# Patient Record
Sex: Female | Born: 2000 | Race: Black or African American | Hispanic: No | Marital: Single | Smoking: Never smoker
Health system: Southern US, Community
[De-identification: ages and names within clinical notes are randomized; demographics above are authoritative.]

## PROBLEM LIST (undated history)

## (undated) ENCOUNTER — Ambulatory Visit: Disposition: A | Discharge status: Still In Hospital | Payer: Self-pay

---
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
## 2021-07-27 ENCOUNTER — Ambulatory Visit: Payer: Commercial Managed Care - PPO | Admitting: Rheumatology

## 2021-08-25 ENCOUNTER — Ambulatory Visit: Payer: Commercial Managed Care - PPO | Admitting: Rheumatology

## 2021-08-28 NOTE — Progress Notes (Signed)
? ?Office Visit Note ? ?Patient: Karen Wilkins             ?Date of Birth: [birth year]           ?MRN: [id number]             ?PCP: Jane Canary, MD ?Referring: Jane Canary, MD ?Visit Date: 09/09/2021 ?Occupation: @GUAROCC @ ? ?Subjective:  ?Joint pain, myalgias, positive ANA ? ?History of Present Illness: Karen Wilkins is a 21 y.o. female seen in consultation per request of her PCP.  According to the patient in November 2022 she developed a cough.  She was seen at the urgent care and was diagnosed with strep throat.  She states her throat culture was positive.  She was treated with penicillin.  2 weeks later she started experiencing increased fatigue, generalized joint and muscle pain and abdominal discomfort.  She also started having headaches.  She has had chronic diarrhea since then without any blood with 1-2 loose stools daily.  She states symptoms lasted for approximately 2 months and improved.  In December 2022 she saw her PCP due to aches and pains at that time the labs showed positive ANA and positive Ro antibody.  In February 2023 she developed upper respiratory tract infection which gradually got better.  She was not given any antibiotics.  She states she has been continuing to have some generalized achiness.  She describes discomfort in her upper back, lower back, shoulders, wrists, hands, hips, feet.  She denies any history of joint swelling.  There is no history of oral ulcers or nasal ulcers.  She gives history of dry mouth.  She denies history of morning stiffness, Raynaud's phenomenon, malar rash, photosensitivity or lymphadenopathy.  She is gravida 1, para 0, abortion 1.  She is currently not using any birth control.  She is sexually active.  There is positive family history of lupus in her father.  According to the patient her father had lupus nephritis and passed away in [id number] due to complications.  She has 1 female sibling with diabetes and 1 female sibling  in good health. ? ?Activities  of Daily Living:  ?Patient reports morning stiffness for 0  none .   ?Patient Denies nocturnal pain.  ?Difficulty dressing/grooming: Denies ?Difficulty climbing stairs: Denies ?Difficulty getting out of chair: Denies ?Difficulty using hands for taps, buttons, cutlery, and/or writing: Denies ? ?Review of Systems  ?Constitutional:  Positive for fatigue.  ?HENT:  Positive for mouth dryness. Negative for mouth sores.   ?Eyes:  Negative for dryness.  ?Respiratory:  Negative for shortness of breath.   ?Cardiovascular:  Negative for chest pain, palpitations and swelling in legs/feet.  ?Gastrointestinal:  Positive for constipation and diarrhea.  ?Endocrine: Positive for cold intolerance and excessive thirst.  ?Genitourinary:  Negative for difficulty urinating.  ?Musculoskeletal:  Positive for joint pain, joint pain and muscle tenderness. Negative for muscle weakness and morning stiffness.  ?Skin:  Negative for color change, rash and sensitivity to sunlight.  ?Allergic/Immunologic: Positive for susceptible to infections.  ?Neurological:  Positive for numbness and weakness.  ?Hematological:  Negative for bruising/bleeding tendency and swollen glands.  ?Psychiatric/Behavioral:  Negative for depressed mood and sleep disturbance. The patient is not nervous/anxious.   ? ?PMFS History:  ?There are no problems to display for this patient. ?  ?History reviewed. No pertinent past medical history.  ?Family History  ?Problem Relation Age of Onset  ? Lupus Father   ? Diabetes Mellitus I Brother   ? ?History  reviewed. No pertinent surgical history. ?Social History  ? ?Social History Narrative  ? Not on file  ? ?Immunization History  ?Administered Date(s) Administered  ? Influenza,inj,Quad PF,6+ Mos 04/14/2018, 03/28/2019  ?  ? ?Objective: ?Vital Signs: BP 122/79 (BP Location: Right Arm, Patient Position: Sitting, Cuff Size: Normal)   Pulse 67   Resp 14   Ht 5\' 2"  (1.575 m)   Wt 140 lb (63.5 kg)   BMI 25.61 kg/m?   ? ?Physical  Exam ?Vitals and nursing note reviewed.  ?Constitutional:   ?   Appearance: She is well-developed.  ?HENT:  ?   Head: Normocephalic and atraumatic.  ?Eyes:  ?   Conjunctiva/sclera: Conjunctivae normal.  ?Cardiovascular:  ?   Rate and Rhythm: Normal rate and regular rhythm.  ?   Heart sounds: Normal heart sounds.  ?Pulmonary:  ?   Effort: Pulmonary effort is normal.  ?   Breath sounds: Normal breath sounds.  ?Abdominal:  ?   General: Bowel sounds are normal.  ?   Palpations: Abdomen is soft.  ?Musculoskeletal:  ?   Cervical back: Normal range of motion.  ?Lymphadenopathy:  ?   Cervical: No cervical adenopathy.  ?Skin: ?   General: Skin is warm and dry.  ?   Capillary Refill: Capillary refill takes less than 2 seconds.  ?   Comments: No nailbed capillary changes, sclerodactyly or Telengectesia's were noted.  ?Neurological:  ?   Mental Status: She is alert and oriented to person, place, and time.  ?Psychiatric:     ?   Behavior: Behavior normal.  ?  ? ?Musculoskeletal Exam: C-spine thoracic and lumbar spine were in good range of motion.  Shoulder joints, elbow joints, wrist joints, MCPs PIPs and DIPs with good range of motion with no synovitis.  Hip joints, knee joints, ankles, MTPs and PIPs with good range of motion with no synovitis. ? ?CDAI Exam: ?CDAI Score: -- ?Patient Global: --; Provider Global: -- ?Swollen: --; Tender: -- ?Joint Exam 09/09/2021  ? ?No joint exam has been documented for this visit  ? ?There is currently no information documented on the homunculus. Go to the Rheumatology activity and complete the homunculus joint exam. ? ?Investigation: ?No additional findings. ? ?Imaging: ?XR Hand 2 View Left ? ?Result Date: 09/09/2021 ?No MCP PIP or DIP narrowing was noted.  No intercarpal or radiocarpal joint space narrowing was noted.  No erosive changes were noted.  Impression: Unremarkable x-ray of the hand. ? ?XR Hand 2 View Right ? ?Result Date: 09/09/2021 ?No MCP PIP or DIP narrowing was noted.  No  intercarpal or radiocarpal joint space narrowing was noted.  No erosive changes were noted.  Impression: Unremarkable x-ray of the hand.  ? ?Recent Labs: ?Lab Results  ?Component Value Date  ? WBC 3.8 (L) 07/04/2019  ? HGB 13.4 07/04/2019  ? PLT 218 07/04/2019  ? NA 136 07/04/2019  ? K 3.5 07/04/2019  ? CL 104 07/04/2019  ? CO2 24 07/04/2019  ? GLUCOSE 86 07/04/2019  ? BUN 19 07/04/2019  ? CREATININE 0.78 07/04/2019  ? BILITOT 0.4 10/01/2017  ? ALKPHOS 48 10/01/2017  ? AST 22 10/01/2017  ? ALT 15 10/01/2017  ? PROT 7.3 10/01/2017  ? ALBUMIN 4.0 10/01/2017  ? CALCIUM 9.0 07/04/2019  ? GFRAA >60 07/04/2019  ? ? ?Speciality Comments: No specialty comments available. ? ?Procedures:  ?No procedures performed ?Allergies: Azithromycin  ? ?Assessment / Plan:     ?Visit Diagnoses: Positive ANA (antinuclear antibody) - 06/02/21:  ANA+, dsDNA 2, RNP-, Sm-, Scl-70-, Ro >8, La-, antichromatin-, Anti-Jo1-, anti-centromere- -patient developed positive ANA and positive Ro antibody after strep infection in November 2022.  She has been experiencing increased fatigue generalized arthralgias and myalgias since then.  She denies history of joint swelling.  She gives history of fatigue and diarrhea.  There is no history of oral ulcers, nasal ulcers, malar rash, photosensitivity or lymphadenopathy.  She gives history of sicca symptoms.  Detailed counsel regarding positive ANA and positive Ro antibody was provided to the patient and her mother.  Association of positive Ro antibody with arrhythmias was discussed.  I advised her to get baseline EKG with her PCP.  Association of anti-Ro antibody with fetal cardiac arrhythmias was also discussed.  I will repeat labs today.  There is family history of lupus nephritis in her father.  Use of strict contraception was discussed.  Patient had an abortion in the past.  She uses condoms for contraception currently.  Use of sunscreen was also discussed.  Plan: CBC with Differential/Platelet, COMPLETE  METABOLIC PANEL WITH GFR, Urinalysis, Routine w reflex microscopic, Protein / creatinine ratio, urine, Sedimentation rate, ANA, Anti-scleroderma antibody, RNP Antibody, Anti-Smith antibody, Sjogrens syndrome-A extractable

## 2021-09-09 ENCOUNTER — Ambulatory Visit (INDEPENDENT_AMBULATORY_CARE_PROVIDER_SITE_OTHER): Payer: Commercial Managed Care - PPO

## 2021-09-09 ENCOUNTER — Encounter: Payer: Self-pay | Admitting: Rheumatology

## 2021-09-09 ENCOUNTER — Ambulatory Visit: Payer: Commercial Managed Care - PPO | Admitting: Rheumatology

## 2021-09-09 ENCOUNTER — Other Ambulatory Visit: Payer: Self-pay

## 2021-09-09 VITALS — BP 122/79 | HR 67 | Resp 14 | Ht 62.0 in | Wt 140.0 lb

## 2021-09-09 DIAGNOSIS — R682 Dry mouth, unspecified: Secondary | ICD-10-CM

## 2021-09-09 DIAGNOSIS — M79641 Pain in right hand: Secondary | ICD-10-CM

## 2021-09-09 DIAGNOSIS — R768 Other specified abnormal immunological findings in serum: Secondary | ICD-10-CM | POA: Diagnosis not present

## 2021-09-09 DIAGNOSIS — K529 Noninfective gastroenteritis and colitis, unspecified: Secondary | ICD-10-CM

## 2021-09-09 DIAGNOSIS — M2550 Pain in unspecified joint: Secondary | ICD-10-CM | POA: Diagnosis not present

## 2021-09-09 DIAGNOSIS — Z8269 Family history of other diseases of the musculoskeletal system and connective tissue: Secondary | ICD-10-CM

## 2021-09-09 DIAGNOSIS — M79642 Pain in left hand: Secondary | ICD-10-CM

## 2021-09-09 DIAGNOSIS — R5383 Other fatigue: Secondary | ICD-10-CM

## 2021-09-09 NOTE — Patient Instructions (Signed)
Use sunscreen more than 50 SPF all the time. ? ?Please schedule appointment with GYN for low estrogen contraception or IUD device. ? ?Please get baseline EKG with your PCP to rule out arrhythmias. ?

## 2021-09-15 ENCOUNTER — Telehealth: Payer: Self-pay | Admitting: *Deleted

## 2021-09-15 NOTE — Telephone Encounter (Signed)
Patient contacted the office concerned about her lab results which can be seen on my chart. Patient advised not all of her results are in and Dr. Corliss Skains would review them once they all return. Patient advised Dr. Corliss Skains reviews these with her at her follow up visit.Patient advised if Dr. Corliss Skains wants her to be seen sooner we will do so. Patient expressed understanding.  ?

## 2021-09-16 LAB — RHEUMATOID FACTOR: Rheumatoid fact SerPl-aCnc: <14 IU/mL (ref <14)

## 2021-09-16 LAB — CBC WITH DIFFERENTIAL/PLATELET
Absolute Monocytes: 357 cells/uL (ref 200–950)
Basophils Absolute: 21 cells/uL (ref 0–200)
Basophils Relative: 0.5 %
Eosinophils Absolute: 111 cells/uL (ref 15–500)
Eosinophils Relative: 2.7 %
HCT: 40.5 % (ref 35.0–45.0)
Hemoglobin: 13.2 g/dL (ref 11.7–15.5)
Lymphs Abs: 1435 cells/uL (ref 850–3900)
MCH: 30.3 pg (ref 27.0–33.0)
MCHC: 32.6 g/dL (ref 32.0–36.0)
MCV: 92.9 fL (ref 80.0–100.0)
MPV: 10.4 fL (ref 7.5–12.5)
Monocytes Relative: 8.7 %
Neutro Abs: 2177 cells/uL (ref 1500–7800)
Neutrophils Relative %: 53.1 %
Platelets: 255 10*3/uL (ref 140–400)
RBC: 4.36 10*6/uL (ref 3.80–5.10)
RDW: 12.8 % (ref 11.0–15.0)
Total Lymphocyte: 35 %
WBC: 4.1 10*3/uL (ref 3.8–10.8)

## 2021-09-16 LAB — PROTEIN / CREATININE RATIO, URINE
Creatinine, Urine: 465 mg/dL — ABNORMAL HIGH (ref 20–275)
Protein/Creat Ratio: 49 mg/g creat (ref 24–184)
Protein/Creatinine Ratio: 0.049 mg/mg creat (ref 0.024–0.184)
Total Protein, Urine: 23 mg/dL (ref 5–24)

## 2021-09-16 LAB — COMPLETE METABOLIC PANEL WITH GFR
AG Ratio: 1.4 (calc) (ref 1.0–2.5)
ALT: 12 U/L (ref 6–29)
AST: 16 U/L (ref 10–30)
Albumin: 4.4 g/dL (ref 3.6–5.1)
Alkaline phosphatase (APISO): 53 U/L (ref 31–125)
BUN: 11 mg/dL (ref 7–25)
CO2: 27 mmol/L (ref 20–32)
Calcium: 9.2 mg/dL (ref 8.6–10.2)
Chloride: 102 mmol/L (ref 98–110)
Creat: 0.75 mg/dL (ref 0.50–0.96)
Globulin: 3.2 g/dL (calc) (ref 1.9–3.7)
Glucose, Bld: 81 mg/dL (ref 65–99)
Potassium: 4.2 mmol/L (ref 3.5–5.3)
Sodium: 139 mmol/L (ref 135–146)
Total Bilirubin: 0.5 mg/dL (ref 0.2–1.2)
Total Protein: 7.6 g/dL (ref 6.1–8.1)
eGFR: 116 mL/min/{1.73_m2} (ref >=60)

## 2021-09-16 LAB — MICROSCOPIC MESSAGE

## 2021-09-16 LAB — RNP ANTIBODY: Ribonucleic Protein(ENA) Antibody, IgG: <1 AI

## 2021-09-16 LAB — URINALYSIS, ROUTINE W REFLEX MICROSCOPIC
Bacteria, UA: NONE SEEN /HPF
Bilirubin Urine: NEGATIVE
Glucose, UA: NEGATIVE
Hgb urine dipstick: NEGATIVE
Leukocytes,Ua: NEGATIVE
Nitrite: NEGATIVE
RBC / HPF: NONE SEEN /HPF (ref 0–2)
Specific Gravity, Urine: 1.028 (ref 1.001–1.035)
WBC, UA: NONE SEEN /HPF (ref 0–5)
pH: 5.5 (ref 5.0–8.0)

## 2021-09-16 LAB — CARDIOLIPIN ANTIBODIES, IGG, IGM, IGA
Anticardiolipin IgA: <2 APL-U/mL (ref <20.0)
Anticardiolipin IgG: <2 GPL-U/mL (ref <20.0)
Anticardiolipin IgM: <2 MPL-U/mL (ref <20.0)

## 2021-09-16 LAB — ANTI-SCLERODERMA ANTIBODY: Scleroderma (Scl-70) (ENA) Antibody, IgG: <1 AI

## 2021-09-16 LAB — BETA-2 GLYCOPROTEIN ANTIBODIES
Beta-2 Glyco 1 IgA: <2 U/mL (ref <20.0)
Beta-2 Glyco 1 IgM: <2 U/mL (ref <20.0)
Beta-2 Glyco I IgG: <2 U/mL (ref <20.0)

## 2021-09-16 LAB — ANTI-DNA ANTIBODY, DOUBLE-STRANDED: ds DNA Ab: 1 IU/mL

## 2021-09-16 LAB — TSH: TSH: 0.71 mIU/L

## 2021-09-16 LAB — CK: Total CK: 91 U/L (ref 29–143)

## 2021-09-16 LAB — C3 AND C4
C3 Complement: 112 mg/dL (ref 83–193)
C4 Complement: 32 mg/dL (ref 15–57)

## 2021-09-16 LAB — SJOGRENS SYNDROME-A EXTRACTABLE NUCLEAR ANTIBODY: SSA (Ro) (ENA) Antibody, IgG: >8 AI — AB

## 2021-09-16 LAB — LUPUS ANTICOAGULANT EVAL W/ REFLEX
PTT-LA Screen: 35 s (ref <=40)
dRVVT: 34 s (ref <=45)

## 2021-09-16 LAB — ANTI-NUCLEAR AB-TITER (ANA TITER)
ANA TITER: 1:320 {titer} — ABNORMAL HIGH
ANA Titer 1: 1:40 {titer} — ABNORMAL HIGH

## 2021-09-16 LAB — SJOGRENS SYNDROME-B EXTRACTABLE NUCLEAR ANTIBODY: SSB (La) (ENA) Antibody, IgG: <1 AI

## 2021-09-16 LAB — SEDIMENTATION RATE: Sed Rate: 9 mm/h (ref 0–20)

## 2021-09-16 LAB — ANTI-SMITH ANTIBODY: ENA SM Ab Ser-aCnc: <1 AI

## 2021-09-16 LAB — ANA: Anti Nuclear Antibody (ANA): POSITIVE — AB

## 2021-09-16 LAB — GLUCOSE 6 PHOSPHATE DEHYDROGENASE: G-6PDH: 13.5 U/g Hgb (ref 7.0–20.5)

## 2021-09-16 NOTE — Progress Notes (Signed)
I will discuss results at the follow-up visit.

## 2021-09-20 NOTE — Progress Notes (Deleted)
Office Visit Note  Patient: Karen Wilkins             Date of Birth: 01-03-2001           MRN: [id number]             PCP: Tommy Medal, MD Referring: Tommy Medal, MD Visit Date: 10/01/2021 Occupation: @GUAROCC @  Subjective:  No chief complaint on file.   History of Present Illness: Karen Wilkins is a 21 y.o. female ***   Activities of Daily Living:  Patient reports morning stiffness for *** {minute/hour:19697}.   Patient {ACTIONS;DENIES/REPORTS:21021675::"Denies"} nocturnal pain.  Difficulty dressing/grooming: {ACTIONS;DENIES/REPORTS:21021675::"Denies"} Difficulty climbing stairs: {ACTIONS;DENIES/REPORTS:21021675::"Denies"} Difficulty getting out of chair: {ACTIONS;DENIES/REPORTS:21021675::"Denies"} Difficulty using hands for taps, buttons, cutlery, and/or writing: {ACTIONS;DENIES/REPORTS:21021675::"Denies"}  No Rheumatology ROS completed.   PMFS History:  Patient Active Problem List   Diagnosis Date Noted   Family history of systemic lupus erythematosus in father 09/09/2021   Positive ANA (antinuclear antibody) 09/09/2021    No past medical history on file.  Family History  Problem Relation Age of Onset   Lupus Father    Diabetes Mellitus I Brother    No past surgical history on file. Social History   Social History Narrative   Not on file   Immunization History  Administered Date(s) Administered   Influenza,inj,Quad PF,6+ Mos 04/14/2018, 03/28/2019     Objective: Vital Signs: There were no vitals taken for this visit.   Physical Exam   Musculoskeletal Exam: ***  CDAI Exam: CDAI Score: -- Patient Global: --; Provider Global: -- Swollen: --; Tender: -- Joint Exam 10/01/2021   No joint exam has been documented for this visit   There is currently no information documented on the homunculus. Go to the Rheumatology activity and complete the homunculus joint exam.  Investigation: No additional findings.  Imaging: XR Hand 2 View  Left  Result Date: 09/09/2021 No MCP PIP or DIP narrowing was noted.  No intercarpal or radiocarpal joint space narrowing was noted.  No erosive changes were noted.  Impression: Unremarkable x-ray of the hand.  XR Hand 2 View Right  Result Date: 09/09/2021 No MCP PIP or DIP narrowing was noted.  No intercarpal or radiocarpal joint space narrowing was noted.  No erosive changes were noted.  Impression: Unremarkable x-ray of the hand.   Recent Labs: Lab Results  Component Value Date   WBC 4.1 09/09/2021   HGB 13.2 09/09/2021   PLT 255 09/09/2021   NA 139 09/09/2021   K 4.2 09/09/2021   CL 102 09/09/2021   CO2 27 09/09/2021   GLUCOSE 81 09/09/2021   BUN 11 09/09/2021   CREATININE 0.75 09/09/2021   BILITOT 0.5 09/09/2021   ALKPHOS 48 10/01/2017   AST 16 09/09/2021   ALT 12 09/09/2021   PROT 7.6 09/09/2021   ALBUMIN 4.0 10/01/2017   CALCIUM 9.2 09/09/2021   GFRAA >60 07/04/2019   September 09, 2021 UA showed 1+ protein and trace ketones, protein creatinine ratio normal.  ANA 1: 40NH, 1: 320NS, SSA antibody > 8.0 (double-stranded DNA, SSB, Smith, RNP, SCL 70 antibodies negative), C3-C4 normal, beta-2 GP 1 negative, anticardiolipin negative, lupus anticoagulant negative, RF negative, G6PD normal, CK 91, TSH normal  06/02/21: ANA+, dsDNA 2, RNP-, Sm-, Scl-70-, Ro >8, La-, antichromatin-, Anti-Jo1-, anti-centromere-  Speciality Comments: No specialty comments available.  Procedures:  No procedures performed Allergies: Azithromycin   Assessment / Plan:     Visit Diagnoses: No diagnosis found.  Orders: No orders of the defined  types were placed in this encounter.  No orders of the defined types were placed in this encounter.   Face-to-face time spent with patient was *** minutes. Greater than 50% of time was spent in counseling and coordination of care.  Follow-Up Instructions: No follow-ups on file.   Bo Merino, MD  Note - This record has been created using Radio producer.  Chart creation errors have been sought, but may not always  have been located. Such creation errors do not reflect on  the standard of medical care.

## 2021-10-01 ENCOUNTER — Ambulatory Visit: Payer: Commercial Managed Care - PPO | Admitting: Rheumatology

## 2021-10-01 DIAGNOSIS — K529 Noninfective gastroenteritis and colitis, unspecified: Secondary | ICD-10-CM

## 2021-10-01 DIAGNOSIS — M3509 Sicca syndrome with other organ involvement: Secondary | ICD-10-CM

## 2021-10-01 DIAGNOSIS — Z8269 Family history of other diseases of the musculoskeletal system and connective tissue: Secondary | ICD-10-CM

## 2021-10-01 DIAGNOSIS — Z79899 Other long term (current) drug therapy: Secondary | ICD-10-CM

## 2021-10-01 DIAGNOSIS — M2550 Pain in unspecified joint: Secondary | ICD-10-CM

## 2021-10-01 DIAGNOSIS — R5383 Other fatigue: Secondary | ICD-10-CM

## 2021-10-01 DIAGNOSIS — M79641 Pain in right hand: Secondary | ICD-10-CM

## 2021-10-01 NOTE — Progress Notes (Signed)
? ?Office Visit Note ? ?Patient: Karen Wilkins             ?Date of Birth: May 29, 2001           ?MRN: [id number]             ?PCP: Jane Canary, MD ?Referring: Jane Canary, MD ?Visit Date: 10/02/2021 ?Occupation: @GUAROCC @ ? ?Subjective:  ?Dry mouth and dry eyes ? ?History of Present Illness: Karen Wilkins is a 21 y.o. female with history of fatigue arthralgias, sicca symptoms.  She returns today to discuss her labs results.  She states she continues to have fatigue and joint pain.  She has not noticed any joint swelling.  She gives history of dry mouth and dry eyes.  She gives history of palpitations.  She denies any history of oral ulcers, nasal ulcers, malar rash, Raynaud's phenomenon, lymphadenopathy, photosensitivity or inflammatory arthritis. ? ?Activities of Daily Living:  ?Patient reports morning stiffness for 1-2 hours.   ?Patient Denies nocturnal pain.  ?Difficulty dressing/grooming: Denies ?Difficulty climbing stairs: Denies ?Difficulty getting out of chair: Denies ?Difficulty using hands for taps, buttons, cutlery, and/or writing: Reports ? ?Review of Systems  ?Constitutional:  Positive for fatigue.  ?HENT:  Positive for mouth dryness. Negative for mouth sores and nose dryness.   ?Eyes:  Negative for pain, itching and dryness.  ?Respiratory:  Negative for shortness of breath and difficulty breathing.   ?Cardiovascular:  Positive for chest pain and palpitations.  ?Gastrointestinal:  Positive for constipation and diarrhea. Negative for blood in stool.  ?Endocrine: Negative for increased urination.  ?Genitourinary:  Negative for difficulty urinating.  ?Musculoskeletal:  Positive for joint pain, joint pain, myalgias, morning stiffness, muscle tenderness and myalgias. Negative for joint swelling.  ?Skin:  Negative for color change, rash and redness.  ?Allergic/Immunologic: Positive for susceptible to infections.  ?Neurological:  Positive for headaches. Negative for dizziness, numbness, memory loss  and weakness.  ?Hematological:  Negative for bruising/bleeding tendency.  ?Psychiatric/Behavioral:  Negative for confusion.   ? ?PMFS History:  ?Patient Active Problem List  ? Diagnosis Date Noted  ? Sjogren's disease ([place]) 10/02/2021  ? Family history of systemic lupus erythematosus in father 09/09/2021  ? Positive ANA (antinuclear antibody) 09/09/2021  ?  ?History reviewed. No pertinent past medical history.  ?Family History  ?Problem Relation Age of Onset  ? Lupus Father   ? Diabetes Mellitus I Brother   ? ?History reviewed. No pertinent surgical history. ?Social History  ? ?Social History Narrative  ? Not on file  ? ?Immunization History  ?Administered Date(s) Administered  ? Influenza,inj,Quad PF,6+ Mos 04/14/2018, 03/28/2019  ?  ? ?Objective: ?Vital Signs: BP 126/85 (BP Location: Left Arm, Patient Position: Sitting, Cuff Size: Normal)   Pulse 80   Ht 5\' 2"  (1.575 m)   Wt 143 lb (64.9 kg)   BMI 26.16 kg/m?   ? ?Physical Exam ?Vitals and nursing note reviewed.  ?Constitutional:   ?   Appearance: She is well-developed.  ?HENT:  ?   Head: Normocephalic and atraumatic.  ?Eyes:  ?   Conjunctiva/sclera: Conjunctivae normal.  ?Cardiovascular:  ?   Rate and Rhythm: Normal rate and regular rhythm.  ?   Heart sounds: Normal heart sounds.  ?Pulmonary:  ?   Effort: Pulmonary effort is normal.  ?   Breath sounds: Normal breath sounds.  ?Abdominal:  ?   General: Bowel sounds are normal.  ?   Palpations: Abdomen is soft.  ?Musculoskeletal:  ?   Cervical back: Normal  range of motion.  ?Lymphadenopathy:  ?   Cervical: No cervical adenopathy.  ?Skin: ?   General: Skin is warm and dry.  ?   Capillary Refill: Capillary refill takes less than 2 seconds.  ?Neurological:  ?   Mental Status: She is alert and oriented to person, place, and time.  ?Psychiatric:     ?   Behavior: Behavior normal.  ?  ? ?Musculoskeletal Exam: C-spine thoracic and lumbar spine were in good range of motion.  Shoulder joints, elbow joints, wrist joints,  MCPs PIPs and DIPs with good range of motion with no synovitis.  Hip joints, knee joints, ankles, MTPs and PIPs with good range of motion with no synovitis. ? ?CDAI Exam: ?CDAI Score: -- ?Patient Global: --; Provider Global: -- ?Swollen: --; Tender: -- ?Joint Exam 10/02/2021  ? ?No joint exam has been documented for this visit  ? ?There is currently no information documented on the homunculus. Go to the Rheumatology activity and complete the homunculus joint exam. ? ?Investigation: ?No additional findings. ? ?Imaging: ?XR Hand 2 View Left ? ?Result Date: 09/09/2021 ?No MCP PIP or DIP narrowing was noted.  No intercarpal or radiocarpal joint space narrowing was noted.  No erosive changes were noted.  Impression: Unremarkable x-ray of the hand. ? ?XR Hand 2 View Right ? ?Result Date: 09/09/2021 ?No MCP PIP or DIP narrowing was noted.  No intercarpal or radiocarpal joint space narrowing was noted.  No erosive changes were noted.  Impression: Unremarkable x-ray of the hand.  ? ?Recent Labs: ?Lab Results  ?Component Value Date  ? WBC 4.1 09/09/2021  ? HGB 13.2 09/09/2021  ? PLT 255 09/09/2021  ? NA 139 09/09/2021  ? K 4.2 09/09/2021  ? CL 102 09/09/2021  ? CO2 27 09/09/2021  ? GLUCOSE 81 09/09/2021  ? BUN 11 09/09/2021  ? CREATININE 0.75 09/09/2021  ? BILITOT 0.5 09/09/2021  ? ALKPHOS 48 10/01/2017  ? AST 16 09/09/2021  ? ALT 12 09/09/2021  ? PROT 7.6 09/09/2021  ? ALBUMIN 4.0 10/01/2017  ? CALCIUM 9.2 09/09/2021  ? GFRAA >60 07/04/2019  ? ?September 09, 2021 urine protein 1+, protein creatinine ratio normal, sed rate 9, CK 91, TSH normal, RF negative, ANA 1: 320NS, SSA positive, (dsDNA, SSB, Smith, RNP, SCL 70 negative, C3-C4 normal, beta-2 GP 1 negative, anticardiolipin negative, lupus anticoagulant negative, G6PD normal, ? ? ?Speciality Comments: No specialty comments available. ? ?Procedures:  ?No procedures performed ?Allergies: Azithromycin  ? ?Assessment / Plan:     ?Visit Diagnoses: Sjogren's syndrome with other organ  involvement ([place]) - Positive ANA 1: 320NS, SSA positive, fatigue, sicca symptoms, arthralgias.  Symptoms started in November 2022 after strep infection.  I had a detailed discussion with the patient and her mother regarding Sjogren's.  She gives history of fatigue, sicca symptoms, arthralgias.  No synovitis was noted.  I also discussed that positive ANA and positive Ro can be associated with lupus but she does not meet the criteria for lupus.  She has a strong family history of lupus in her father.  Over-the-counter products for Sjogren's were discussed.  Different treatment options and their side effects were discussed.  After discussing indications side effects contraindications we decided to place her on hydroxychloroquine.  A handout was given and consent was taken.  My plan is to start her on hydroxychloroquine 200 mg p.o. twice daily Monday to Friday.  She was advised to get a baseline eye examination and then eye examination on an yearly  basis.  Use of sunscreen was discussed.  Use of a strict contraception preferably IUD was advised.  She will has to be on low estrogen contraception.  Increased risk of fetal cardiac conduction abnormality in patients with positive SSA antibody was discussed.  If she gets pregnant she will need close monitoring. ? ?Patient was counseled on the purpose, proper use, and adverse effects of hydroxychloroquine including nausea/diarrhea, skin rash, headaches, and sun sensitivity.  Advised patient to wear sunscreen once starting hydroxychloroquine to reduce risk of rash associated with sun sensitivity.  Discussed importance of annual eye exams while on hydroxychloroquine to monitor to ocular toxicity and discussed importance of frequent laboratory monitoring.  Provided patient with eye exam form for baseline ophthalmologic exam.  Reviewed risk for QTC prolongation when used in combination with other QTc prolonging agents (including but not limited to antiarrhythmics, macrolide  antibiotics, flouroquinolones, tricyclic antidepressants, citalopram, specific antipsychotics, ondansetron, migraine triptans, and methadone). Provided patient with educational materials on hydroxychloroquine

## 2021-10-02 ENCOUNTER — Ambulatory Visit (INDEPENDENT_AMBULATORY_CARE_PROVIDER_SITE_OTHER): Payer: Commercial Managed Care - PPO | Admitting: Rheumatology

## 2021-10-02 ENCOUNTER — Encounter: Payer: Self-pay | Admitting: Rheumatology

## 2021-10-02 VITALS — BP 126/85 | HR 80 | Ht 62.0 in | Wt 143.0 lb

## 2021-10-02 DIAGNOSIS — M3509 Sicca syndrome with other organ involvement: Secondary | ICD-10-CM

## 2021-10-02 DIAGNOSIS — M79642 Pain in left hand: Secondary | ICD-10-CM

## 2021-10-02 DIAGNOSIS — Z8269 Family history of other diseases of the musculoskeletal system and connective tissue: Secondary | ICD-10-CM

## 2021-10-02 DIAGNOSIS — R5383 Other fatigue: Secondary | ICD-10-CM

## 2021-10-02 DIAGNOSIS — M3500 Sicca syndrome, unspecified: Secondary | ICD-10-CM | POA: Insufficient documentation

## 2021-10-02 DIAGNOSIS — K529 Noninfective gastroenteritis and colitis, unspecified: Secondary | ICD-10-CM

## 2021-10-02 DIAGNOSIS — M79641 Pain in right hand: Secondary | ICD-10-CM

## 2021-10-02 DIAGNOSIS — Z79899 Other long term (current) drug therapy: Secondary | ICD-10-CM

## 2021-10-02 DIAGNOSIS — R002 Palpitations: Secondary | ICD-10-CM

## 2021-10-02 MED ORDER — HYDROXYCHLOROQUINE SULFATE 200 MG PO TABS: ORAL_TABLET | ORAL | 2 refills | Status: AC

## 2021-10-02 NOTE — Patient Instructions (Signed)
Hydroxychloroquine Tablets ?What is this medication? ?HYDROXYCHLOROQUINE (hye drox ee KLOR oh kwin) treats autoimmune conditions, such as rheumatoid arthritis and lupus. It works by slowing down an overactive immune system. It may also be used to prevent and treat malaria. It works by killing the parasite that causes malaria. It belongs to a group of medications called DMARDs. ?This medicine may be used for other purposes; ask your health care provider or pharmacist if you have questions. ?COMMON BRAND NAME(S): Plaquenil, Quineprox ?What should I tell my care team before I take this medication? ?They need to know if you have any of these conditions: ?Diabetes ?Eye disease, vision problems ?G6PD deficiency ?Heart disease ?History of irregular heartbeat ?If you often drink alcohol ?Kidney disease ?Liver disease ?Porphyria ?Psoriasis ?An unusual or allergic reaction to chloroquine, hydroxychloroquine, other medications, foods, dyes, or preservatives ?Pregnant or trying to get pregnant ?Breast-feeding ?How should I use this medication? ?Take this medication by mouth with a glass of water. Take it as directed on the prescription label. Do not cut, crush or chew this medication. Swallow the tablets whole. Take it with food. Do not take it more than directed. Take all of this medication unless your care team tells you to stop it early. Keep taking it even if you think you are better. ?Take products with antacids in them at a different time of day than this medication. Take this medication 4 hours before or 4 hours after antacids. Talk to your care team if you have questions. ?Talk to your care team about the use of this medication in children. While this medication may be prescribed for selected conditions, precautions do apply. ?Overdosage: If you think you have taken too much of this medicine contact a poison control center or emergency room at once. ?NOTE: This medicine is only for you. Do not share this medicine with  others. ?What if I miss a dose? ?If you miss a dose, take it as soon as you can. If it is almost time for your next dose, take only that dose. Do not take double or extra doses. ?What may interact with this medication? ?Do not take this medication with any of the following: ?Cisapride ?Dronedarone ?Pimozide ?Thioridazine ?This medication may also interact with the following: ?Ampicillin ?Antacids ?Cimetidine ?Cyclosporine ?Digoxin ?Kaolin ?Medications for diabetes, like insulin, glipizide, glyburide ?Medications for seizures like carbamazepine, phenobarbital, phenytoin ?Mefloquine ?Methotrexate ?Other medications that prolong the QT interval (cause an abnormal heart rhythm) ?Praziquantel ?This list may not describe all possible interactions. Give your health care provider a list of all the medicines, herbs, non-prescription drugs, or dietary supplements you use. Also tell them if you smoke, drink alcohol, or use illegal drugs. Some items may interact with your medicine. ?What should I watch for while using this medication? ?Visit your care team for regular checks on your progress. Tell your care team if your symptoms do not start to get better or if they get worse. ?You may need blood work done while you are taking this medication. If you take other medications that can affect heart rhythm, you may need more testing. Talk to your care team if you have questions. ?Your vision may be tested before and during use of this medication. Tell your care team right away if you have any change in your eyesight. ?This medication may cause serious skin reactions. They can happen weeks to months after starting the medication. Contact your care team right away if you notice fevers or flu-like symptoms with a rash. The   rash may be red or purple and then turn into blisters or peeling of the skin. Or, you might notice a red rash with swelling of the face, lips or lymph nodes in your neck or under your arms. ?If you or your family  notice any changes in your behavior, such as new or worsening depression, thoughts of harming yourself, anxiety, or other unusual or disturbing thoughts, or memory loss, call your care team right away. ?What side effects may I notice from receiving this medication? ?Side effects that you should report to your care team as soon as possible: ?Allergic reactions--skin rash, itching, hives, swelling of the face, lips, tongue, or throat ?Aplastic anemia--unusual weakness or fatigue, dizziness, headache, trouble breathing, increased bleeding or bruising ?Change in vision ?Heart rhythm changes--fast or irregular heartbeat, dizziness, feeling faint or lightheaded, chest pain, trouble breathing ?Infection--fever, chills, cough, or sore throat ?Low blood sugar (hypoglycemia)--tremors or shaking, anxiety, sweating, cold or clammy skin, confusion, dizziness, rapid heartbeat ?Muscle injury--unusual weakness or fatigue, muscle pain, dark yellow or brown urine, decrease in amount of urine ?Pain, tingling, or numbness in the hands or feet ?Rash, fever, and swollen lymph nodes ?Redness, blistering, peeling, or loosening of the skin, including inside the mouth ?Thoughts of suicide or self-harm, worsening mood, or feelings of depression ?Unusual bruising or bleeding ?Side effects that usually do not require medical attention (report to your care team if they continue or are bothersome): ?Diarrhea ?Headache ?Nausea ?Stomach pain ?Vomiting ?This list may not describe all possible side effects. Call your doctor for medical advice about side effects. You may report side effects to FDA at 1-800-FDA-1088. ?Where should I keep my medication? ?Keep out of the reach of children and pets. ?Store at room temperature up to 30 degrees C (86 degrees F). Protect from light. Get rid of any unused medication after the expiration date. ?To get rid of medications that are no longer needed or have expired: ?Take the medication to a medication take-back  program. Check with your pharmacy or law enforcement to find a location. ?If you cannot return the medication, check the label or package insert to see if the medication should be thrown out in the garbage or flushed down the toilet. If you are not sure, ask your care team. If it is safe to put it in the trash, empty the medication out of the container. Mix the medication with cat litter, dirt, coffee grounds, or other unwanted substance. Seal the mixture in a bag or container. Put it in the trash. ?NOTE: This sheet is a summary. It may not cover all possible information. If you have questions about this medicine, talk to your doctor, pharmacist, or health care provider. ?? 2023 Elsevier/Gold Standard (2020-10-16 00:00:00) ? ?Standing Labs ?We placed an order today for your standing lab work.  ? ?Please have your standing labs drawn in 1 month, and then 3 months  ? ?If possible, please have your labs drawn 2 weeks prior to your appointment so that the provider can discuss your results at your appointment. ? ?Please note that you may see your imaging and lab results in MyChart before we have reviewed them. ?We may be awaiting multiple results to interpret others before contacting you. ?Please allow our office up to 72 hours to thoroughly review all of the results before contacting the office for clarification of your results. ? ?We have open lab daily: ?Monday through Thursday from 1:30-4:30 PM and Friday from 1:30-4:00 PM ?at the office of Dr. Janalyn Rouse  [facility], [facility].   ?Please be advised, all patients with office appointments requiring lab work will take precedent over walk-in lab work.  ?If possible, please come for your lab work on Monday and Friday afternoons, as you may experience shorter wait times. ?The office is located at [address], [address], [place], [place] [address] ?No appointment is necessary.   ?Labs are drawn by [facility]. Please bring your co-pay at the time of your lab draw.   You may receive a bill from [facility] for your lab work. ? ?Please note if you are on Hydroxychloroquine and and an order has been placed for a Hydroxychloroquine level, you will need to have it drawn 4 hours o

## 2021-10-12 ENCOUNTER — Ambulatory Visit: Payer: Commercial Managed Care - PPO | Admitting: Rheumatology

## 2021-10-26 ENCOUNTER — Telehealth: Payer: Self-pay | Admitting: *Deleted

## 2021-10-26 NOTE — Telephone Encounter (Signed)
Patient contacted the office stating she has not started Plaquenil and would like to go ahead and get started on it now. Patient advised a prescription has been sent to the pharmacy for her already and should be on file at the pharmacy. Patient advised to give them a call so they can get it ready for her. Patient states she would like to have the prescription filled at the [facility] in [place]. Patient advised the pharmacy should be able to transfer the prescription for her. Patient states she recently found out she is pregnant. Patient advised Plaquenil is safe to take during pregnancy and it is important she stay on it during the pregnancy. Patient advised to contact the OBGYN to establish care as she is high risk. Patient expressed understanding. D. Deveshwar advised.   ?

## 2021-10-30 NOTE — Progress Notes (Deleted)
   Office Visit Note  Patient: Karen Wilkins             Date of Birth: [birth year]           MRN: [id number]             PCP: Jane Canary, MD Referring: Jane Canary, MD Visit Date: 11/13/2021 Occupation: @GUAROCC @  Subjective:  No chief complaint on file.   History of Present Illness: Karen Wilkins is a 21 y.o. female ***   Activities of Daily Living:  Patient reports morning stiffness for *** {minute/hour:19697}.   Patient {ACTIONS;DENIES/REPORTS:21021675::"Denies"} nocturnal pain.  Difficulty dressing/grooming: {ACTIONS;DENIES/REPORTS:21021675::"Denies"} Difficulty climbing stairs: {ACTIONS;DENIES/REPORTS:21021675::"Denies"} Difficulty getting out of chair: {ACTIONS;DENIES/REPORTS:21021675::"Denies"} Difficulty using hands for taps, buttons, cutlery, and/or writing: {ACTIONS;DENIES/REPORTS:21021675::"Denies"}  No Rheumatology ROS completed.   PMFS History:  Patient Active Problem List   Diagnosis Date Noted   Sjogren's disease ([place]) 10/02/2021   Family history of systemic lupus erythematosus in father 09/09/2021   Positive ANA (antinuclear antibody) 09/09/2021    No past medical history on file.  Family History  Problem Relation Age of Onset   Lupus Father    Diabetes Mellitus I Brother    No past surgical history on file. Social History   Social History Narrative   Not on file   Immunization History  Administered Date(s) Administered   Influenza,inj,Quad PF,6+ Mos 04/14/2018, 03/28/2019     Objective: Vital Signs: There were no vitals taken for this visit.   Physical Exam   Musculoskeletal Exam: ***  CDAI Exam: CDAI Score: -- Patient Global: --; Provider Global: -- Swollen: --; Tender: -- Joint Exam 11/13/2021   No joint exam has been documented for this visit   There is currently no information documented on the homunculus. Go to the Rheumatology activity and complete the homunculus joint exam.  Investigation: No additional  findings.  Imaging: No results found.  Recent Labs: Lab Results  Component Value Date   WBC 4.1 09/09/2021   HGB 13.2 09/09/2021   PLT 255 09/09/2021   NA 139 09/09/2021   K 4.2 09/09/2021   CL 102 09/09/2021   CO2 27 09/09/2021   GLUCOSE 81 09/09/2021   BUN 11 09/09/2021   CREATININE 0.75 09/09/2021   BILITOT 0.5 09/09/2021   ALKPHOS 48 10/01/2017   AST 16 09/09/2021   ALT 12 09/09/2021   PROT 7.6 09/09/2021   ALBUMIN 4.0 10/01/2017   CALCIUM 9.2 09/09/2021   GFRAA >60 07/04/2019    Speciality Comments: No specialty comments available.  Procedures:  No procedures performed Allergies: Azithromycin   Assessment / Plan:     Visit Diagnoses: No diagnosis found.  Orders: No orders of the defined types were placed in this encounter.  No orders of the defined types were placed in this encounter.   Face-to-face time spent with patient was *** minutes. Greater than 50% of time was spent in counseling and coordination of care.  Follow-Up Instructions: No follow-ups on file.   Earnestine Mealing, CMA  Note - This record has been created using Editor, commissioning.  Chart creation errors have been sought, but may not always  have been located. Such creation errors do not reflect on  the standard of medical care.

## 2021-11-03 ENCOUNTER — Ambulatory Visit
Admission: EM | Admit: 2021-11-03 | Discharge: 2021-11-03 | Disposition: A | Discharge status: Home / Self Care | Payer: Self-pay | Attending: Family Medicine | Admitting: Family Medicine

## 2021-11-03 VITALS — BP 119/82 | HR 87 | Temp 98.2°F | Resp 16

## 2021-11-03 DIAGNOSIS — R058 Other specified cough: Secondary | ICD-10-CM

## 2021-11-03 DIAGNOSIS — J302 Other seasonal allergic rhinitis: Secondary | ICD-10-CM

## 2021-11-03 DIAGNOSIS — J3089 Other allergic rhinitis: Secondary | ICD-10-CM

## 2021-11-03 MED ORDER — FLUTICASONE PROPIONATE 50 MCG/ACT NA SUSP: 2.0000 | Freq: Every day | NASAL | 0 refills | Status: AC

## 2021-11-03 MED ORDER — CETIRIZINE HCL 10 MG PO TABS: 10.0000 mg | ORAL_TABLET | Freq: Every day | ORAL | 0 refills | Status: AC

## 2021-11-03 NOTE — Discharge Instructions (Addendum)
Recommend use of plain Robitussin.  You will need to ask the pharmacist at the local drugstore to provide you with this medication.  You can take as directed.  Also suspect symptoms are related to outdoor allergens recommend starting a nightly dose of cetirizine 10 mg.  For nasal symptoms I have prescribed you Flonase use once daily as needed to prevent sinus inflammation and congestion.

## 2021-11-03 NOTE — ED Provider Notes (Signed)
Renaldo Fiddler    CSN: [id number] Arrival date & time: 11/03/21  0900      History   Chief Complaint Chief Complaint  Patient presents with   Cough    HPI Karen Wilkins is a 21 y.o. female.   HPI Patient with a recent history of newly diagnosed pregnancy, Sjogren's Disease, presents today with cough and nasal congestion x several months. Cough is worst at at bedtime and upon awakening in the morning.  Patient endorses that she occasionally coughs up clear phlegm.  She denies any history of asthma.  Denies any associated wheezing.  Endorses some occasional shortness of breath due to nasal congestion.  Patient is also approximately 6 to [redacted] weeks pregnant.  She is scheduled to see her OB/GYN within the next few weeks.  She has not taken any medication for symptoms.  History reviewed. No pertinent past medical history.  Patient Active Problem List   Diagnosis Date Noted   Sjogren's disease ([facility]) 10/02/2021   Family history of systemic lupus erythematosus in father 09/09/2021   Positive ANA (antinuclear antibody) 09/09/2021    History reviewed. No pertinent surgical history.  OB History     Gravida  1   Para      Term      Preterm      AB      Living         SAB      IAB      Ectopic      Multiple      Live Births               Home Medications    Prior to Admission medications   Medication Sig Start Date End Date Taking? Authorizing Provider  hydroxychloroquine (PLAQUENIL) 200 MG tablet Take 200mg  by mouth twice daily, Monday through Friday only. None on Saturday or Sunday. 10/02/21   10/04/21, MD    Family History Family History  Problem Relation Age of Onset   Lupus Father    Diabetes Mellitus I Brother     Social History Social History   Tobacco Use   Smoking status: Never    Passive exposure: Never   Smokeless tobacco: Never  Vaping Use   Vaping Use: Former  Substance Use Topics   Alcohol use: Never   Drug use:  Never     Allergies   Azithromycin   Review of Systems Review of Systems Pertinent negatives listed in HPI   Physical Exam Triage Vital Signs ED Triage Vitals [11/03/21 0917]  Enc Vitals Group     BP 119/82     Pulse Rate 87     Resp 16     Temp 98.2 F (36.8 C)     Temp Source Oral     SpO2 100 %     Weight      Height      Head Circumference      Peak Flow      Pain Score      Pain Loc      Pain Edu?      Excl. in GC?    No data found.  Updated Vital Signs BP 119/82 (BP Location: Left Arm)   Pulse 87   Temp 98.2 F (36.8 C) (Oral)   Resp 16   LMP 09/22/2021   SpO2 100%   Visual Acuity Right Eye Distance:   Left Eye Distance:   Bilateral Distance:    Right Eye Near:  Left Eye Near:    Bilateral Near:     Physical Exam Constitutional:      Appearance: Normal appearance.  HENT:     Head: Normocephalic.     Nose: Congestion and rhinorrhea present.  Eyes:     Extraocular Movements: Extraocular movements intact.     Pupils: Pupils are equal, round, and reactive to light.  Cardiovascular:     Rate and Rhythm: Normal rate and regular rhythm.  Pulmonary:     Effort: Pulmonary effort is normal.     Breath sounds: Normal breath sounds.  Musculoskeletal:        General: Normal range of motion.  Lymphadenopathy:     Cervical: No cervical adenopathy.  Skin:    General: Skin is warm.     Capillary Refill: Capillary refill takes less than 2 seconds.  Neurological:     General: No focal deficit present.     Mental Status: She is alert and oriented to person, place, and time.  Psychiatric:        Mood and Affect: Mood normal.        Behavior: Behavior normal.        Thought Content: Thought content normal.        Judgment: Judgment normal.     UC Treatments / Results  Labs (all labs ordered are listed, but only abnormal results are displayed) Labs Reviewed - No data to display  EKG   Radiology No results found.  Procedures Procedures  (including critical care time)  Medications Ordered in UC Medications - No data to display  Initial Impression / Assessment and Plan / UC Course  I have reviewed the triage vital signs and the nursing notes.  Pertinent labs & imaging results that were available during my care of the patient were reviewed by me and considered in my medical decision making (see chart for details).    Overall findings consistent with that of allergic rhinitis and congestion contributing to seasonal allergies inducing a postnasal drip which is resulting in a cough.  Recommend allergy symptom management with cetirizine.  Patient is pregnant therefore recommended OTC plain Robitussin as needed for cough.  Also recommended Flonase daily to prevent recurrent see of allergic rhinitis.  Advised to follow-up with OB/GYN as scheduled.  Return here or follow-up with PCP if symptoms worsen or do not readily improve. Final Clinical Impressions(s) / UC Diagnoses   Final diagnoses:  Other cough  Seasonal allergies  Allergic rhinitis due to other allergic trigger, unspecified seasonality   Discharge Instructions   None    ED Prescriptions     Medication Sig Dispense Auth. Provider   fluticasone (FLONASE) 50 MCG/ACT nasal spray Place 2 sprays into both nostrils daily. 16 g Bing Neighbors, FNP   cetirizine (ZYRTEC ALLERGY) 10 MG tablet Take 1 tablet (10 mg total) by mouth daily. 90 tablet Bing Neighbors, FNP      PDMP not reviewed this encounter.   Bing Neighbors, [place] 11/03/21 [phone]

## 2021-11-03 NOTE — ED Triage Notes (Signed)
Pt presents with cough x 3-4 months

## 2021-11-13 ENCOUNTER — Ambulatory Visit: Payer: Self-pay | Admitting: Physician Assistant

## 2021-11-13 DIAGNOSIS — M3509 Sicca syndrome with other organ involvement: Secondary | ICD-10-CM

## 2021-11-13 DIAGNOSIS — R5383 Other fatigue: Secondary | ICD-10-CM

## 2021-11-13 DIAGNOSIS — M79641 Pain in right hand: Secondary | ICD-10-CM

## 2021-11-13 DIAGNOSIS — R002 Palpitations: Secondary | ICD-10-CM

## 2021-11-13 DIAGNOSIS — K529 Noninfective gastroenteritis and colitis, unspecified: Secondary | ICD-10-CM

## 2021-11-13 DIAGNOSIS — Z79899 Other long term (current) drug therapy: Secondary | ICD-10-CM

## 2021-11-13 DIAGNOSIS — Z8269 Family history of other diseases of the musculoskeletal system and connective tissue: Secondary | ICD-10-CM

## 2024-01-10 ENCOUNTER — Other Ambulatory Visit: Payer: Self-pay | Admitting: Family

## 2024-01-10 DIAGNOSIS — M79662 Pain in left lower leg: Secondary | ICD-10-CM

## 2024-01-11 ENCOUNTER — Ambulatory Visit
Admission: RE | Admit: 2024-01-11 | Discharge: 2024-01-11 | Disposition: A | Discharge status: Home / Self Care | Source: Ambulatory Visit | Attending: Family | Admitting: Family

## 2024-01-11 DIAGNOSIS — M79662 Pain in left lower leg: Secondary | ICD-10-CM | POA: Diagnosis present
